# Patient Record
Sex: Male | Born: 2000 | Race: White | Hispanic: No | Marital: Single | Smoking: Never smoker
Health system: Southern US, Community
[De-identification: ages and names within clinical notes are randomized; demographics above are authoritative.]

## PROBLEM LIST (undated history)

## (undated) DIAGNOSIS — J45909 Unspecified asthma, uncomplicated: Secondary | ICD-10-CM

## (undated) DIAGNOSIS — F909 Attention-deficit hyperactivity disorder, unspecified type: Secondary | ICD-10-CM

## (undated) HISTORY — PX: ORIF CLAVICULAR FRACTURE: SHX5055

## (undated) HISTORY — DX: Unspecified asthma, uncomplicated: J45.909

## (undated) HISTORY — PX: TONSILLECTOMY AND ADENOIDECTOMY: SHX28

## (undated) HISTORY — DX: Attention-deficit hyperactivity disorder, unspecified type: F90.9

---
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
## 2021-11-27 ENCOUNTER — Other Ambulatory Visit: Payer: Self-pay

## 2021-11-27 ENCOUNTER — Ambulatory Visit (INDEPENDENT_AMBULATORY_CARE_PROVIDER_SITE_OTHER): Payer: BC Managed Care – PPO | Admitting: Oncology

## 2021-11-27 ENCOUNTER — Encounter: Payer: Self-pay | Admitting: Oncology

## 2021-11-27 VITALS — BP 124/70 | HR 88 | Temp 97.1°F | Resp 18 | Ht 70.0 in

## 2021-11-27 DIAGNOSIS — R051 Acute cough: Secondary | ICD-10-CM | POA: Diagnosis not present

## 2021-11-27 DIAGNOSIS — R062 Wheezing: Secondary | ICD-10-CM | POA: Diagnosis not present

## 2021-11-27 MED ORDER — DEXAMETHASONE SODIUM PHOSPHATE 4 MG/ML IJ SOLN
4.0000 mg | Freq: Once | INTRAMUSCULAR | Status: AC
  Administered 2021-11-27: 4 mg via INTRAMUSCULAR

## 2021-11-27 MED ORDER — AZITHROMYCIN 250 MG PO TABS: ORAL_TABLET | ORAL | 0 refills | Status: AC

## 2021-11-27 NOTE — Progress Notes (Signed)
[place]. [place], [place] [address] Phone: [phone] Fax: [phone]   Office Visit Note  Patient Name: Robert Huerta  Date of [id number]  Med Rec number [id number]  Date of Service: 11/27/2021  Patient has no known allergies.  Chief Complaint  Patient presents with   Cough   Patient is an 21 y.o. student here for complaints of cough X 2-3 days. Cough started yesterday. Woke up this morning with painful cough. Has been taking delsym. Has pretty bad allergies and takes allegra.  Has history of asthma.  From [place] and states during the fall and spring he has asthma flares and requires dexamethasone injections.  He prefers injections over oral due to tolerance.  Current Medication:  No outpatient encounter medications on file as of 11/27/2021.   No facility-administered encounter medications on file as of 11/27/2021.    Medical History: No past medical history on file.  Vital Signs: BP 124/70   Pulse 88   Temp (!) 97.1 F (36.2 C) (Tympanic)   Resp 18   Ht 5\' 10"  (1.778 m)   SpO2 99%   BMI 26.54 kg/m   ROS: As per HPI.  All other pertinent ROS negative.     Review of Systems  HENT:  Positive for sinus pressure and sinus pain.   Respiratory:  Positive for cough.     Physical Exam Constitutional:      Appearance: He is well-developed.  HENT:     Right Ear: Tympanic membrane normal.     Left Ear: Tympanic membrane normal.     Nose: Nasal tenderness, mucosal edema, congestion and rhinorrhea present.     Right Turbinates: Swollen.     Left Turbinates: Swollen.     Right Sinus: No maxillary sinus tenderness or frontal sinus tenderness.     Left Sinus: No maxillary sinus tenderness or frontal sinus tenderness.     Mouth/Throat:     Mouth: Mucous membranes are moist.     Pharynx: Oropharynx is clear. Posterior oropharyngeal erythema present.     Tonsils: No tonsillar exudate.     Comments: PND Pulmonary:     Breath sounds:  Examination of the right-upper field reveals wheezing. Wheezing present.  Neurological:     Mental Status: He is alert.     No results found for this or any previous visit (from the past 24 hour(s)).  Assessment/Plan: 1. Wheezing -Likely secondary to seasonal allergies and history of asthma.  4 mg Decadron injection given in right deltoid today.  Discussed side effects of medication.  Prescription for azithromycin 250 mg tablet sent to pharmacy should he need this tomorrow.  If he does not need, he was instructed to not take or pick up based on how he feels in the morning.  Discussed side effects from dexamethasone and azithromycin and provided written instructions via MyChart education.  - dexamethasone (DECADRON) injection 4 mg  2. Cough -Continue over-the-counter decongestant such as DayQuil/NyQuil, Mucinex or Sudafed.  Recommend Flonase 2 sprays each nostril daily and continue Delsym as needed.   Disposition-return to clinic if symptoms do not improve or worsen.  General Counseling: Leavy Cella understanding of the findings of todays visit and agrees with plan of treatment. I have discussed any further diagnostic evaluation that may be needed or ordered today. We also reviewed his medications today. he has been encouraged to call the office with any questions or concerns that should arise related to todays visit.   No orders of the  defined types were placed in this encounter.   No orders of the defined types were placed in this encounter.   I spent 20 minutes dedicated to the care of this patient (face-to-face and non-face-to-face) on the date of the encounter to include what is described in the assessment and plan.   Faythe Casa, NP 11/27/2021 10:53 AM

## 2022-04-22 ENCOUNTER — Encounter: Payer: Self-pay | Admitting: Medical

## 2022-04-22 ENCOUNTER — Ambulatory Visit (INDEPENDENT_AMBULATORY_CARE_PROVIDER_SITE_OTHER): Payer: BC Managed Care – PPO | Admitting: Medical

## 2022-04-22 ENCOUNTER — Other Ambulatory Visit: Payer: Self-pay

## 2022-04-22 VITALS — BP 120/83 | HR 78 | Temp 97.7°F | Wt 220.0 lb

## 2022-04-22 DIAGNOSIS — J069 Acute upper respiratory infection, unspecified: Secondary | ICD-10-CM | POA: Diagnosis not present

## 2022-04-22 DIAGNOSIS — J3489 Other specified disorders of nose and nasal sinuses: Secondary | ICD-10-CM

## 2022-04-22 DIAGNOSIS — J309 Allergic rhinitis, unspecified: Secondary | ICD-10-CM | POA: Diagnosis not present

## 2022-04-22 LAB — POC COVID19 BINAXNOW: SARS Coronavirus 2 Ag: NEGATIVE

## 2022-04-22 NOTE — Progress Notes (Signed)
[place]. [place], [place] [address] Phone: [phone] Fax: [phone]   Office Visit Note  Patient Name: Robert Huerta  Date of [id number]  Med Rec number [id number]  Date of Service: 04/22/2022  Allergies: Patient has no known allergies.  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  Sx began 2 days ago with post nasal drainage and scratchy throat. Has since developed head/sinus congestion. Mostly clear nasal d/c. Throat discomfort has improved. Denies HA or myalgias. Some chills last night, has not suspected fever, has not had one when checked. Has been fatigued. No cough.   Had mild cold sx in late Jan/early Feb, lasted only a day or so. No sick contacts/known exposures.  Has not had flu shot. Took at home COVID test last night, was negative.   Taking Allegra and Flonase (takes most days), has spring time allergies. Dayquil yesterday.  He states he has hx of sinus infections associated with his allergies.   Current Medication:  Outpatient Encounter Medications as of 04/22/2022  Medication Sig   lisdexamfetamine (VYVANSE) 50 MG capsule Take 50 mg by mouth daily.   No facility-administered encounter medications on file as of 04/22/2022.      Medical History: ADHD Asthma Environmental allergies   Vital Signs: BP 120/83   Pulse 78   Temp 97.7 F (36.5 C) (Tympanic)   Wt 220 lb (99.8 kg)   SpO2 98%   BMI 31.57 kg/m    Review of Systems See HPI  Physical Exam Vitals reviewed.  Constitutional:      General: He is not in acute distress.    Appearance: He is not ill-appearing.     Comments: Tired appearing  HENT:     Head: Normocephalic.     Right Ear: Ear canal and external ear normal.     Left Ear: Ear canal and external ear normal.     Ears:     Comments: TMs slightly dull bilaterally.    Nose: Mucosal edema (R>L), congestion (R>L) and rhinorrhea  present. Rhinorrhea is clear.     Right Turbinates: Swollen (mild-moderate).     Left Turbinates: Swollen (slightly).     Comments: Some pressure with palpation over maxillary sinuses, not pain.    Mouth/Throat:     Mouth: Mucous membranes are moist. No oral lesions.     Pharynx: Posterior oropharyngeal erythema (slight) present. No pharyngeal swelling.     Comments: Tonsils absent bilaterally Cardiovascular:     Rate and Rhythm: Normal rate and regular rhythm.     Heart sounds: No murmur heard.    No friction rub. No gallop.  Pulmonary:     Effort: Pulmonary effort is normal.     Breath sounds: Normal breath sounds. No wheezing, rhonchi or rales.  Musculoskeletal:     Cervical back: Neck supple. No rigidity.  Lymphadenopathy:     Cervical: Cervical adenopathy (small anterior nodes, slightly tender) present.  Neurological:     Mental Status: He is alert.     Recent Results (from the past 2160 hour(s))  POC COVID-19     Status: Normal   Collection Time: 04/22/22  1:45 PM  Result Value Ref Range   SARS Coronavirus 2 Ag Negative Negative     Assessment/Plan: 1. Acute upper respiratory infection 2. Sinus pressure 3. Allergic rhinitis, unspecified seasonality, unspecified trigger Suspect combination of allergies and viral infection. POC COVID-19 antigen test  negative. Advised to add Advil Cold & Sinus. Recommended neti pot/saline sinus rinses in addition to Allegra and Flonase. Encouraged supportive measures.   - POC COVID-19  Patient Instructions:  -Rest and stay well hydrated (by drinking water and other liquids). Avoid/limit caffeine. -Take over-the-counter medicines (i.e. Advil Cold & Sinus) to help relieve your symptoms. -Continue Allegra and Flonase. -Consider trying saline sinus rinse/neti pot twice a day to help relieve congestion. -For your sore throat, use cough drops/throat lozenges, gargle warm salt water and/or drink warm liquids (like tea with honey). -Send  MyChart message to provider or schedule return visit as needed for new/worsening symptoms (i.e. fever, increasing sinus pressure/pain) or if symptoms do not improve as discussed with recommended treatment over next 5-7 days.  General Counseling: Leavy Cella understanding of the findings of todays visit and agrees with plan of treatment. he has been encouraged to call the office with any questions or concerns that should arise related to todays visit.   Time spent:20 [place] PA-C [place] 04/22/2022 1:08 PM

## 2022-04-27 NOTE — Patient Instructions (Signed)
-  Rest and stay well hydrated (by drinking water and other liquids). Avoid/limit caffeine. -Take over-the-counter medicines (i.e. Advil Cold & Sinus) to help relieve your symptoms. -Continue Allegra and Flonase. -Consider trying saline sinus rinse/neti pot twice a day to help relieve congestion. -For your sore throat, use cough drops/throat lozenges, gargle warm salt water and/or drink warm liquids (like tea with honey). -Send MyChart message to provider or schedule return visit as needed for new/worsening symptoms (i.e. fever, increasing sinus pressure/pain) or if symptoms do not improve as discussed with recommended treatment over next 5-7 days.

## 2022-05-08 ENCOUNTER — Encounter: Payer: Self-pay | Admitting: Oncology

## 2022-05-08 ENCOUNTER — Ambulatory Visit: Payer: BC Managed Care – PPO | Admitting: Oncology

## 2022-05-08 ENCOUNTER — Ambulatory Visit (INDEPENDENT_AMBULATORY_CARE_PROVIDER_SITE_OTHER): Payer: BC Managed Care – PPO | Admitting: Oncology

## 2022-05-08 VITALS — BP 118/78 | HR 103 | Temp 97.2°F | Resp 18 | Ht 70.0 in | Wt 220.0 lb

## 2022-05-08 DIAGNOSIS — T300 Burn of unspecified body region, unspecified degree: Secondary | ICD-10-CM

## 2022-05-08 MED ORDER — DOXYCYCLINE HYCLATE 100 MG PO TABS: 100.0000 mg | ORAL_TABLET | Freq: Two times a day (BID) | ORAL | 0 refills | Status: AC
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
